# Patient Record
Sex: Female | Born: 1955 | Marital: Single | Smoking: Never smoker
Health system: Southern US, Community
[De-identification: ages and names within clinical notes are randomized; demographics above are authoritative.]

## PROBLEM LIST (undated history)

## (undated) DIAGNOSIS — Z803 Family history of malignant neoplasm of breast: Secondary | ICD-10-CM

## (undated) DIAGNOSIS — Z860100 Personal history of colon polyps, unspecified: Secondary | ICD-10-CM

## (undated) DIAGNOSIS — R011 Cardiac murmur, unspecified: Secondary | ICD-10-CM

## (undated) DIAGNOSIS — A6000 Herpesviral infection of urogenital system, unspecified: Secondary | ICD-10-CM

## (undated) DIAGNOSIS — Z800 Family history of malignant neoplasm of digestive organs: Secondary | ICD-10-CM

## (undated) DIAGNOSIS — N390 Urinary tract infection, site not specified: Secondary | ICD-10-CM

## (undated) DIAGNOSIS — Z807 Family history of other malignant neoplasms of lymphoid, hematopoietic and related tissues: Secondary | ICD-10-CM

## (undated) DIAGNOSIS — Z808 Family history of malignant neoplasm of other organs or systems: Secondary | ICD-10-CM

## (undated) DIAGNOSIS — E785 Hyperlipidemia, unspecified: Secondary | ICD-10-CM

## (undated) DIAGNOSIS — Z8049 Family history of malignant neoplasm of other genital organs: Secondary | ICD-10-CM

## (undated) DIAGNOSIS — M1990 Unspecified osteoarthritis, unspecified site: Secondary | ICD-10-CM

## (undated) DIAGNOSIS — K759 Inflammatory liver disease, unspecified: Secondary | ICD-10-CM

## (undated) HISTORY — DX: Family history of malignant neoplasm of breast: Z80.3

## (undated) HISTORY — DX: Inflammatory liver disease, unspecified: K75.9

## (undated) HISTORY — DX: Family history of malignant neoplasm of digestive organs: Z80.0

## (undated) HISTORY — DX: Cardiac murmur, unspecified: R01.1

## (undated) HISTORY — DX: Urinary tract infection, site not specified: N39.0

## (undated) HISTORY — PX: REDUCTION MAMMAPLASTY: SUR839

## (undated) HISTORY — PX: BREAST BIOPSY: SHX20

## (undated) HISTORY — DX: Unspecified osteoarthritis, unspecified site: M19.90

## (undated) HISTORY — DX: Family history of malignant neoplasm of other genital organs: Z80.49

## (undated) HISTORY — DX: Personal history of colon polyps, unspecified: Z86.0100

## (undated) HISTORY — DX: Hyperlipidemia, unspecified: E78.5

## (undated) HISTORY — DX: Herpesviral infection of urogenital system, unspecified: A60.00

## (undated) HISTORY — DX: Personal history of colonic polyps: Z86.010

## (undated) HISTORY — DX: Family history of malignant neoplasm of other organs or systems: Z80.8

## (undated) HISTORY — DX: Family history of other malignant neoplasms of lymphoid, hematopoietic and related tissues: Z80.7

---

## 2015-04-18 DIAGNOSIS — M25552 Pain in left hip: Secondary | ICD-10-CM | POA: Insufficient documentation

## 2018-06-20 ENCOUNTER — Ambulatory Visit (INDEPENDENT_AMBULATORY_CARE_PROVIDER_SITE_OTHER): Payer: BLUE CROSS/BLUE SHIELD

## 2018-06-20 ENCOUNTER — Encounter: Payer: Self-pay | Admitting: Podiatry

## 2018-06-20 ENCOUNTER — Ambulatory Visit: Payer: BLUE CROSS/BLUE SHIELD | Admitting: Podiatry

## 2018-06-20 VITALS — BP 144/78 | HR 64 | Resp 16

## 2018-06-20 DIAGNOSIS — M778 Other enthesopathies, not elsewhere classified: Secondary | ICD-10-CM

## 2018-06-20 DIAGNOSIS — M2012 Hallux valgus (acquired), left foot: Secondary | ICD-10-CM

## 2018-06-20 DIAGNOSIS — M7751 Other enthesopathy of right foot: Secondary | ICD-10-CM | POA: Diagnosis not present

## 2018-06-20 DIAGNOSIS — M779 Enthesopathy, unspecified: Secondary | ICD-10-CM

## 2018-06-21 NOTE — Progress Notes (Signed)
Subjective:  Patient ID: Maureen Peterson, female    DOB: 01/17/1956,  MRN: [id number] HPI Chief Complaint  Patient presents with  . Foot Pain    5th MPJ right - aching x 1 months, started new part time job standing a lot, walks barefoot all the time, puffy, tried wider shoes  . Foot Pain    1st MPJ left - bunion more tender - has questions  . New Patient (Initial Visit)    62 y.o. female presents with the above complaint.   ROS: Denies fever chills nausea vomiting muscle aches pains calf pain back pain chest pain shortness of breath.  No past medical history on file.   Current Outpatient Medications:  .  diclofenac (VOLTAREN) 75 MG EC tablet, Take 75 mg by mouth 2 (two) times daily., Disp: , Rfl:  .  diclofenac sodium (VOLTAREN) 1 % GEL, Apply topically 4 (four) times daily., Disp: , Rfl:  .  ezetimibe (ZETIA) 10 MG tablet, Take 10 mg by mouth daily., Disp: , Rfl:  .  ibuprofen (ADVIL,MOTRIN) 800 MG tablet, , Disp: , Rfl: 0  Allergies  Allergen Reactions  . Penicillin G Rash   Review of Systems Objective:   Vitals:   06/20/18 1429  BP: (!) 144/78  Pulse: 64  Resp: 16    General: Well developed, nourished, in no acute distress, alert and oriented x3   Dermatological: Skin is warm, dry and supple bilateral. Nails x 10 are well maintained; remaining integument appears unremarkable at this time. There are no open sores, no preulcerative lesions, no rash or signs of infection present.  Vascular: Dorsalis Pedis artery and Posterior Tibial artery pedal pulses are 2/4 bilateral with immedate capillary fill time. Pedal hair growth present. No varicosities and no lower extremity edema present bilateral.   Neruologic: Grossly intact via light touch bilateral. Vibratory intact via tuning fork bilateral. Protective threshold with Semmes Wienstein monofilament intact to all pedal sites bilateral. Patellar and Achilles deep tendon reflexes 2+ bilateral. No Babinski or clonus noted  bilateral.   Musculoskeletal: No gross boney pedal deformities bilateral. No pain, crepitus, or limitation noted with foot and ankle range of motion bilateral. Muscular strength 5/5 in all groups tested bilateral.  She has pain on range of motion and on palpation of the first metatarsophalangeal joint left.  There is no crepitation.  Right fifth metatarsal phalangeal joint area does demonstrate a red body fluctuant area beneath the fifth metatarsal head.  This does appear to be a bursitis or long-standing capsulitis with inflammation.  Gait: Unassisted, Nonantalgic.    Radiographs:  Radiographs taken today demonstrate soft tissue swelling along a plantar lateral aspect of the fifth metatarsal right foot is to be bursitis or gout on radiograph.  There is no acute finding.  Left foot does demonstrate some osteoarthritic changes at the level of the first metatarsophalangeal joint joint space narrowing some irregularity of the joint and subchondral bone.  Assessment & Plan:   Assessment: Bursitis fifth metatarsal phalangeal joint right foot.  Capsulitis first metatarsophalangeal joint left foot  Plan: After sterile Betadine skin prep I injected 20 mg Kenalog 5 mg Marcaine to the point of maximal tenderness into the bursa of the fifth metatarsal phalangeal joint area of the right foot.  At this point I also injected 2 mg of dexamethasone and local anesthetic to the first metatarsophalangeal joint of the left foot.  This was an intra-articular injection tolerated procedure well with no adverse effects.     Max T.  [place], [place]

## 2018-08-01 ENCOUNTER — Encounter: Payer: Self-pay | Admitting: Podiatry

## 2018-08-01 ENCOUNTER — Ambulatory Visit: Payer: BLUE CROSS/BLUE SHIELD | Admitting: Podiatry

## 2018-08-01 DIAGNOSIS — M7751 Other enthesopathy of right foot: Secondary | ICD-10-CM

## 2018-08-01 DIAGNOSIS — M779 Enthesopathy, unspecified: Secondary | ICD-10-CM | POA: Diagnosis not present

## 2018-08-01 DIAGNOSIS — M778 Other enthesopathies, not elsewhere classified: Secondary | ICD-10-CM

## 2018-08-02 NOTE — Progress Notes (Signed)
She presents today for follow-up of her bursitis of the fifth metatarsal area right foot and capsulitis to the first metatarsophalangeal joint left foot.  She states that she is been on her feet a lot and even so they seem to be doing better but there is still some pain.  He states that her daughter has breast cancer at the age of 63.  Objective: Vital signs are stable she is alert and oriented x3.  She has some tenderness on palpation sub-fifth metatarsal head of the right foot which appears to be more of a bursitis or even possible a nerve pain like a neuroma that is plantarly.  It may simply be inflammation of the flexor tendon.  She also has pain on palpation and range of motion of the first metatarsophalangeal joint of the left foot  Assessment: Bursitis neuritis sub-fifth metatarsal phalangeal joint of the right foot.  Capsulitis first metatarsophalangeal joint left foot.  Plan: Discussed etiology pathology conservative surgical therapies at this point I injected dexamethasone 2 mg after sterile Betadine skin prep to the sub-fifth metatarsal phalangeal joint area from the lateral aspect.  Tolerated procedure well without complications.  After sterile Betadine skin prep I then injected the first metatarsophalangeal joint of the left foot with dexamethasone 2 mg and local anesthetic.  Tolerated procedure well.  I will follow-up with her in the next few weeks for reevaluation.

## 2018-08-11 ENCOUNTER — Ambulatory Visit: Payer: BLUE CROSS/BLUE SHIELD | Admitting: Family Medicine

## 2018-08-11 ENCOUNTER — Encounter: Payer: Self-pay | Admitting: Family Medicine

## 2018-08-11 ENCOUNTER — Other Ambulatory Visit: Payer: Self-pay | Admitting: Family Medicine

## 2018-08-11 VITALS — BP 124/72 | HR 64 | Temp 97.8°F | Ht 66.0 in | Wt 162.0 lb

## 2018-08-11 DIAGNOSIS — Z8619 Personal history of other infectious and parasitic diseases: Secondary | ICD-10-CM | POA: Diagnosis not present

## 2018-08-11 DIAGNOSIS — Z131 Encounter for screening for diabetes mellitus: Secondary | ICD-10-CM

## 2018-08-11 DIAGNOSIS — Z0000 Encounter for general adult medical examination without abnormal findings: Secondary | ICD-10-CM | POA: Diagnosis not present

## 2018-08-11 DIAGNOSIS — M720 Palmar fascial fibromatosis [Dupuytren]: Secondary | ICD-10-CM | POA: Insufficient documentation

## 2018-08-11 DIAGNOSIS — H6982 Other specified disorders of Eustachian tube, left ear: Secondary | ICD-10-CM | POA: Diagnosis not present

## 2018-08-11 DIAGNOSIS — Z1231 Encounter for screening mammogram for malignant neoplasm of breast: Secondary | ICD-10-CM

## 2018-08-11 DIAGNOSIS — R599 Enlarged lymph nodes, unspecified: Secondary | ICD-10-CM

## 2018-08-11 DIAGNOSIS — R011 Cardiac murmur, unspecified: Secondary | ICD-10-CM | POA: Insufficient documentation

## 2018-08-11 DIAGNOSIS — E782 Mixed hyperlipidemia: Secondary | ICD-10-CM | POA: Diagnosis not present

## 2018-08-11 DIAGNOSIS — M1990 Unspecified osteoarthritis, unspecified site: Secondary | ICD-10-CM | POA: Insufficient documentation

## 2018-08-11 DIAGNOSIS — Z23 Encounter for immunization: Secondary | ICD-10-CM

## 2018-08-11 LAB — COMPREHENSIVE METABOLIC PANEL
ALBUMIN: 4.2 g/dL (ref 3.5–5.2)
ALT: 27 U/L (ref 0–35)
AST: 25 U/L (ref 0–37)
Alkaline Phosphatase: 122 U/L — ABNORMAL HIGH (ref 39–117)
BUN: 18 mg/dL (ref 6–23)
CHLORIDE: 106 meq/L (ref 96–112)
CO2: 29 mEq/L (ref 19–32)
CREATININE: 0.74 mg/dL (ref 0.40–1.20)
Calcium: 9.5 mg/dL (ref 8.4–10.5)
GFR: 84.05 mL/min (ref >60.00)
GLUCOSE: 104 mg/dL — AB (ref 70–99)
POTASSIUM: 4.4 meq/L (ref 3.5–5.1)
SODIUM: 140 meq/L (ref 135–145)
Total Bilirubin: 0.4 mg/dL (ref 0.2–1.2)
Total Protein: 6.8 g/dL (ref 6.0–8.3)

## 2018-08-11 LAB — CBC WITH DIFFERENTIAL/PLATELET
BASOS PCT: 0.7 % (ref 0.0–3.0)
Basophils Absolute: 0 10*3/uL (ref 0.0–0.1)
EOS ABS: 0 10*3/uL (ref 0.0–0.7)
Eosinophils Relative: 0.8 % (ref 0.0–5.0)
HEMATOCRIT: 38.5 % (ref 36.0–46.0)
HEMOGLOBIN: 12.6 g/dL (ref 12.0–15.0)
LYMPHS PCT: 36.9 % (ref 12.0–46.0)
Lymphs Abs: 2 10*3/uL (ref 0.7–4.0)
MCHC: 32.8 g/dL (ref 30.0–36.0)
MCV: 88.2 fl (ref 78.0–100.0)
MONO ABS: 0.4 10*3/uL (ref 0.1–1.0)
Monocytes Relative: 8.2 % (ref 3.0–12.0)
Neutro Abs: 2.8 10*3/uL (ref 1.4–7.7)
Neutrophils Relative %: 53.4 % (ref 43.0–77.0)
Platelets: 268 10*3/uL (ref 150.0–400.0)
RBC: 4.36 Mil/uL (ref 3.87–5.11)
RDW: 14.1 % (ref 11.5–15.5)
WBC: 5.3 10*3/uL (ref 4.0–10.5)

## 2018-08-11 LAB — LIPID PANEL
CHOLESTEROL: 231 mg/dL — AB (ref 0–200)
HDL: 45.8 mg/dL (ref >39.00)
LDL CALC: 147 mg/dL — AB (ref 0–99)
NONHDL: 184.77
Total CHOL/HDL Ratio: 5
Triglycerides: 188 mg/dL — ABNORMAL HIGH (ref 0.0–149.0)
VLDL: 37.6 mg/dL (ref 0.0–40.0)

## 2018-08-11 LAB — HEMOGLOBIN A1C: HEMOGLOBIN A1C: 6.1 % (ref 4.6–6.5)

## 2018-08-11 NOTE — Progress Notes (Signed)
Subjective:     Maureen Peterson is a 62 y.o. female and is here for a comprehensive physical exam. The patient reports problems - L ear discomfort, dupuytren's contracture, HLD, h/o heart murmur, arthritis.  Pt previously seen at [facility] in [place], [place] by Dr. August Luz.  H/o enlarged lymph nodes: -seen on CT -given concern for lymphoma was getting serial CTs to monitor progress.  -also had PET scan. -last CT Dec 2018  HLD: -taking Zetia -eating any and everything  H/o heart murmur: -had a cardiac cath -cannot remember if had an ECHO -was seen in [place], [place]  Dupuytren's contracture: -wakes up with R index finger "stuck" in a flexed position -had steroid injections in past -has been trying to find a hand surgeon to continue injections  H/o hepatitis? -pt notes having hepatitis when her daughter was 53 yo. -states was in the hospital and had to get shots  Arthritis: -in foot -seen by podiatry -had cortisone injections recently  -also had bursitis in R foot  Ear discomfort: -pt notes L ear [place] popping [place]  Allergies: PCN-hives Amoxicillin- stomach upset, hives  Past surg hx: -Cholecystectomy 1996 Breast biopsy 2017 Appendectomy 1996 Tonsillectomy 1959 Hysterectomy 1994 Left rotator cuff repair 1992 Bladder sling 2002 Left hip labral tear repair 2016 Carpal tunnel right hand 2018, carpal tunnel ulnar nerve moved 2018 Basal cell carcinoma removed from back 2015 Breast reduction, tummy tuck, liposuction 2011  Social hx: Pt is divorced.  Her husband was in the [employer].  Pt has 2 children (a son and a daughter).  Pt's daughter is also seen by this provider.  Pt recently moved to the area from [place], [place].  Pt denies tobacco and drug use.  Mammogram 2018, September Dentist--Dr. Domingo Dimes [phone] Vision-last eye exam 2018 Colonoscopy 2017 Social History   Socioeconomic History  . Marital  status: Single    Spouse name: Not on file  . Number of children: Not on file  . Years of education: Not on file  . Highest education level: Not on file  Occupational History  . Not on file  Social Needs  . Financial resource strain: Not on file  . Food insecurity:    Worry: Not on file    Inability: Not on file  . Transportation needs:    Medical: Not on file    Non-medical: Not on file  Tobacco Use  . Smoking status: Never Smoker  . Smokeless tobacco: Never Used  Substance and Sexual Activity  . Alcohol use: Not Currently  . Drug use: Not on file  . Sexual activity: Not on file  Lifestyle  . Physical activity:    Days per week: Not on file    Minutes per session: Not on file  . Stress: Not on file  Relationships  . Social connections:    Talks on phone: Not on file    Gets together: Not on file    Attends religious service: Not on file    Active member of club or organization: Not on file    Attends meetings of clubs or organizations: Not on file    Relationship status: Not on file  . Intimate partner violence:    Fear of current or ex partner: Not on file    Emotionally abused: Not on file    Physically abused: Not on file    Forced sexual activity: Not on file  Other Topics Concern  . Not on file  Social History Narrative  .  Not on file   Health Maintenance  Topic Date Due  . Hepatitis C Screening  Feb 22, 1956  . HIV Screening  11/19/1969  . TETANUS/TDAP  11/19/1973  . PAP SMEAR  11/20/1975  . MAMMOGRAM  11/19/2004  . COLONOSCOPY  11/19/2004  . INFLUENZA VACCINE  06/15/2018    The following portions of the patient's history were reviewed and updated as appropriate: allergies, current medications, past family history, past medical history, past social history, past surgical history and problem list.  Review of Systems A comprehensive review of systems was negative.   Objective:    BP 124/72   Pulse 64   Temp 97.8 F (36.6 C)   Ht 5\' 6"  (1.676 m)    Wt 162 lb (73.5 kg)   SpO2 97%   BMI 26.15 kg/m  General appearance: alert, cooperative and no distress Head: Normocephalic, without obvious abnormality, atraumatic Eyes: conjunctivae/corneas clear. PERRL, EOM's intact. Fundi benign. Ears: normal TM's and external ear canals both ears Nose: Nares normal. Septum midline. Mucosa normal. No drainage or sinus tenderness. Throat: lips, mucosa, and tongue normal; teeth and gums normal Neck: no adenopathy, no carotid bruit, no JVD, supple, symmetrical, trachea midline and thyroid not enlarged, symmetric, no tenderness/mass/nodules Lungs: clear to auscultation bilaterally Heart: RRR, faint 1/6 murmur heard. Abdomen: soft, non-tender; bowel sounds normal; no masses,  no organomegaly Extremities: extremities normal, atraumatic, no cyanosis or edema Skin: Skin color, texture, turgor normal. No rashes or lesions Lymph nodes: Cervical and supraclavicular nodes normal. Neurologic: Alert and oriented X 3, normal strength and tone. Normal symmetric reflexes. Normal coordination and gait    Assessment:    Healthy female exam.      Plan:     Anticipatory guidance given including wearing seatbelts, smoke detectors in the home, increasing physical activity, increasing p.o. intake of water and vegetables. -will obtain labs See After Visit Summary for Counseling Recommendations    Eustachian tube dysfunction -maneuver to relieve pressure done in office -discussed using flonase prn -given handout  Dupuytren's contracture -consider steroid injection -referral to hand surgery  HLD -lipid panel  Arthritis -continue f/u with podiatry  F/u prn  Abbe Amsterdam, MD

## 2018-08-11 NOTE — Patient Instructions (Addendum)
Preventive Care 40-64 Years, Female Preventive care refers to lifestyle choices and visits with your health care provider that can promote health and wellness. What does preventive care include?  A yearly physical exam. This is also called an annual well check.  Dental exams once or twice a year.  Routine eye exams. Ask your health care provider how often you should have your eyes checked.  Personal lifestyle choices, including: ? Daily care of your teeth and gums. ? Regular physical activity. ? Eating a healthy diet. ? Avoiding tobacco and drug use. ? Limiting alcohol use. ? Practicing safe sex. ? Taking low-dose aspirin daily starting at age 58. ? Taking vitamin and mineral supplements as recommended by your health care provider. What happens during an annual well check? The services and screenings done by your health care provider during your annual well check will depend on your age, overall health, lifestyle risk factors, and family history of disease. Counseling Your health care provider may ask you questions about your:  Alcohol use.  Tobacco use.  Drug use.  Emotional well-being.  Home and relationship well-being.  Sexual activity.  Eating habits.  Work and work Statistician.  Method of birth control.  Menstrual cycle.  Pregnancy history.  Screening You may have the following tests or measurements:  Height, weight, and BMI.  Blood pressure.  Lipid and cholesterol levels. These may be checked every 5 years, or more frequently if you are over 81 years old.  Skin check.  Lung cancer screening. You may have this screening every year starting at age 78 if you have a 30-pack-year history of smoking and currently smoke or have quit within the past 15 years.  Fecal occult blood test (FOBT) of the stool. You may have this test every year starting at age 65.  Flexible sigmoidoscopy or colonoscopy. You may have a sigmoidoscopy every 5 years or a colonoscopy  every 10 years starting at age 30.  Hepatitis C blood test.  Hepatitis B blood test.  Sexually transmitted disease (STD) testing.  Diabetes screening. This is done by checking your blood sugar (glucose) after you have not eaten for a while (fasting). You may have this done every 1-3 years.  Mammogram. This may be done every 1-2 years. Talk to your health care provider about when you should start having regular mammograms. This may depend on whether you have a family history of breast cancer.  BRCA-related cancer screening. This may be done if you have a family history of breast, ovarian, tubal, or peritoneal cancers.  Pelvic exam and Pap test. This may be done every 3 years starting at age 80. Starting at age 36, this may be done every 5 years if you have a Pap test in combination with an HPV test.  Bone density scan. This is done to screen for osteoporosis. You may have this scan if you are at high risk for osteoporosis.  Discuss your test results, treatment options, and if necessary, the need for more tests with your health care provider. Vaccines Your health care provider may recommend certain vaccines, such as:  Influenza vaccine. This is recommended every year.  Tetanus, diphtheria, and acellular pertussis (Tdap, Td) vaccine. You may need a Td booster every 10 years.  Varicella vaccine. You may need this if you have not been vaccinated.  Zoster vaccine. You may need this after age 5.  Measles, mumps, and rubella (MMR) vaccine. You may need at least one dose of MMR if you were born in  1958 or later. You may also need a second dose.  Pneumococcal 13-valent conjugate (PCV13) vaccine. You may need this if you have certain conditions and were not previously vaccinated.  Pneumococcal polysaccharide (PPSV23) vaccine. You may need one or two doses if you smoke cigarettes or if you have certain conditions.  Meningococcal vaccine. You may need this if you have certain  conditions.  Hepatitis A vaccine. You may need this if you have certain conditions or if you travel or work in places where you may be exposed to hepatitis A.  Hepatitis B vaccine. You may need this if you have certain conditions or if you travel or work in places where you may be exposed to hepatitis B.  Haemophilus influenzae type b (Hib) vaccine. You may need this if you have certain conditions.  Talk to your health care provider about which screenings and vaccines you need and how often you need them. This information is not intended to replace advice given to you by your health care provider. Make sure you discuss any questions you have with your health care provider. Document Released: 11/28/2015 Document Revised: 07/21/2016 Document Reviewed: 09/02/2015 Elsevier Interactive Patient Education  2018 [employer]  Eustachian Tube Dysfunction The eustachian tube connects the middle ear to the back of the nose. It regulates air pressure in the middle ear by allowing air to move between the ear and nose. It also helps to drain fluid from the middle ear space. When the eustachian tube does not function properly, air pressure, fluid, or both can build up in the middle ear. Eustachian tube dysfunction can affect one or both ears. What are the causes? This condition happens when the eustachian tube becomes blocked or cannot open normally. This may result from:  Ear infections.  Colds and other upper respiratory infections.  Allergies.  Irritation, such as from cigarette smoke or acid from the stomach coming up into the esophagus (gastroesophageal reflux).  Sudden changes in air pressure, such as from descending in an airplane.  Abnormal growths in the nose or throat, such as nasal polyps, tumors, or enlarged tissue at the back of the throat (adenoids).  What increases the risk? This condition may be more likely to develop in people who smoke and people who are overweight. Eustachian  tube dysfunction may also be more likely to develop in children, especially children who have:  Certain birth defects of the mouth, such as cleft palate.  Large tonsils and adenoids.  What are the signs or symptoms? Symptoms of this condition may include:  A feeling of fullness in the ear.  Ear pain.  Clicking or popping noises in the ear.  Ringing in the ear.  Hearing loss.  Loss of balance.  Symptoms may get worse when the air pressure around you changes, such as when you travel to an area of high elevation or fly on an airplane. How is this diagnosed? This condition may be diagnosed based on:  Your symptoms.  A physical exam of your ear, nose, and throat.  Tests, such as those that measure: ? The movement of your eardrum (tympanogram). ? Your hearing (audiometry).  How is this treated? Treatment depends on the cause and severity of your condition. If your symptoms are mild, you may be able to relieve your symptoms by moving air into ("popping") your ears. If you have symptoms of fluid in your ears, treatment may include:  Decongestants.  Antihistamines.  Nasal sprays or ear drops that contain medicines that reduce swelling (  steroids).  In some cases, you may need to have a procedure to drain the fluid in your eardrum (myringotomy). In this procedure, a small tube is placed in the eardrum to:  Drain the fluid.  Restore the air in the middle ear space.  Follow these instructions at home:  Take over-the-counter and prescription medicines only as told by your health care provider.  Use techniques to help pop your ears as recommended by your health care provider. These may include: ? Chewing gum. ? Yawning. ? Frequent, forceful swallowing. ? Closing your mouth, holding your nose closed, and gently blowing as if you are trying to blow air out of your nose.  Do not do any of the following until your health care provider approves: ? Travel to high  altitudes. ? Fly in airplanes. ? Work in a pressurized cabin or room. ? Scuba dive.  Keep your ears dry. Dry your ears completely after showering or bathing.  Do not smoke.  Keep all follow-up visits as told by your health care provider. This is important. Contact a health care provider if:  Your symptoms do not go away after treatment.  Your symptoms come back after treatment.  You are unable to pop your ears.  You have: ? A fever. ? Pain in your ear. ? Pain in your head or neck. ? Fluid draining from your ear.  Your hearing suddenly changes.  You become very dizzy.  You lose your balance. This information is not intended to replace advice given to you by your health care provider. Make sure you discuss any questions you have with your health care provider. Document Released: 11/28/2015 Document Revised: 04/08/2016 Document Reviewed: 11/20/2014 Elsevier Interactive Patient Education  2018 [employer]  Dupuytren Contracture Dupuytren contracture is a condition in which tissue under the skin of the palm becomes abnormally thickened. This causes one or more of the fingers to curl inward (contract) toward the palm. Eventually, the fingers may not be able to straighten out. This condition affects some or all of the fingers and the palm of the hand. It is often passed along from parent to child (inherited). Dupuytren contracture is a long-term (chronic) condition that develops (progresses) slowly over time. There is no cure, but symptoms can be managed and progression can be slowed with treatment. This condition is usually not dangerous or painful, but it can interfere with everyday tasks. What are the causes? This condition is caused by tissue (fascia) in the palm getting thicker and tighter. When the fascia thickens, it pulls on the cords of tissue (tendons) that control finger movement. This causes the fingers to contract. The cause of fascia thickening is not known. What  increases the risk? This condition may be more likely to develop in:  People who are age 40 or older.  Men.  People with a family history of this condition.  People who use tobacco products, including cigarettes, chewing tobacco, and e-cigarettes.  People who drink alcohol excessively.  People with diabetes.  People with autoimmune diseases, such as HIV.  People with seizure disorders.  What are the signs or symptoms? Symptoms may develop in one or both hands. Any of the fingers can contract. The fingers farthest from the thumb are commonly affected. Usually, this condition is painless. You may have discomfort when holding or grabbing objects. Early symptoms of this condition may include:  Thick, puckered skin on the hand.  One or more lumps (nodules) on the palm. Nodules may be tender when they   first appear, but they are generally painless.  Symptoms of this condition develop slowly over months or years. Later symptoms of this condition may include:  Thick cords of tissue in the palm.  Fingers curled up toward the palm.  Inability to straighten the fingers into their normal position.  How is this diagnosed? This condition is diagnosed with a physical exam, which may include:  Looking at your hands and feeling your hands. This is to check for thickened fascia and nodules.  Measuring finger motion.  Doing the [place] Tabletop Test. You may be asked to try to put your hand on a surface, with your palm down and your fingers straight out.  How is this treated? There is no cure for this condition, but treatment can make symptoms more manageable and relieve discomfort. Treatment options may include:  Physical therapy. This can strengthen your hand and increase flexibility.  Occupational therapy. This can help you with everyday tasks that may be more difficult because of your condition.  A hand splint.  Shots (injections). Substances may be injected into your hand, such  as: ? Medicines that help to decrease swelling (corticosteroids). ? Proteins (collagenase) to weaken thick tissue. After a collagenase injection, your health care provider may stretch your fingers.  Needle aponeurotomy. In this procedure, a needle is pushed through the skin and into the fascia. Moving the needle against the fascia can weaken or break up the thick tissue.  Surgery. This may be needed if your condition causes discomfort or interferes with everyday activities. Physical therapy is usually needed after surgery.  In some cases, symptoms never develop to the point of needing major treatment, and caring for yourself at home can be enough to manage your condition. Symptoms often return after treatment. Follow these instructions at home: If you have a splint:  Do not put pressure on any part of the splint until it is fully hardened. This may take several hours.  Wear the splint as told by your health care provider. Remove it only as told by your health care provider.  Loosen the splint if your fingers tingle, become numb, or turn cold and blue.  Do not let your splint get wet if it is not waterproof. ? If your splint is not waterproof, cover it with a watertight covering when you take a bath or a shower. ? Do not take baths, swim, or use a hot tub until your health care provider approves. Ask your health care provider if you can take showers. You may only be allowed to take sponge baths for bathing.  Keep the splint clean.  Ask your health care provider when it is safe to drive. Hand Care  Take these actions to help protect your hand from possible injury: ? Use tools that have padded grips. ? Wear protective gloves while you work with your hands. ? Avoid repetitive hand movements.  Avoid actions that cause pain or discomfort.  Stretch your hand by gently pulling your fingers backward toward your wrist. Do this as often as is comfortable. Stop if this causes pain.  Gently  massage your hand as often as is comfortable.  If directed, apply heat to the affected area as often as told by your health care provider. Use the heat source that your health care provider recommends, such as a moist heat pack or a heating pad. ? Place a towel between your skin and the heat source. ? Leave the heat on for 20-30 minutes. ? Remove the heat if   your skin turns bright red. This is especially important if you are unable to feel pain, heat, or cold. You may have a greater risk of getting burned. General instructions  Take over-the-counter and prescription medicines only as told by your health care provider.  Manage any other conditions that you have, such as diabetes.  If physical therapy was prescribed, do exercises as told by your health care provider.  Keep all follow-up visits as told by your health care provider. This is important. Contact a health care provider if:  You develop new symptoms, or your symptoms get worse.  You have pain that gets worse or does not get better with medicine.  You have difficulty or discomfort with everyday tasks.  You have problems with your splint.  You develop numbness or tingling. Get help right away if:  You have severe pain.  Your fingers change color or become unusually cold. This information is not intended to replace advice given to you by your health care provider. Make sure you discuss any questions you have with your health care provider. Document Released: 08/29/2009 Document Revised: 12/16/2015 Document Reviewed: 03/26/2015 Elsevier Interactive Patient Education  2018 [employer] Inc[employer]  

## 2018-08-23 ENCOUNTER — Ambulatory Visit: Payer: Self-pay

## 2018-08-29 ENCOUNTER — Ambulatory Visit (INDEPENDENT_AMBULATORY_CARE_PROVIDER_SITE_OTHER): Payer: BLUE CROSS/BLUE SHIELD | Admitting: *Deleted

## 2018-08-29 DIAGNOSIS — Z23 Encounter for immunization: Secondary | ICD-10-CM | POA: Diagnosis not present

## 2018-09-04 ENCOUNTER — Telehealth: Payer: Self-pay | Admitting: Licensed Clinical Social Worker

## 2018-09-04 NOTE — Telephone Encounter (Signed)
Pt's daughter cld to schedule a genetic counseling appt. Ms. Gauger has been scheduled to see Lacy Duverney on 11/14 at 9am.

## 2018-09-05 ENCOUNTER — Ambulatory Visit: Payer: BLUE CROSS/BLUE SHIELD | Admitting: Family Medicine

## 2018-09-05 ENCOUNTER — Encounter: Payer: Self-pay | Admitting: Family Medicine

## 2018-09-05 VITALS — BP 120/70 | HR 61 | Temp 98.1°F | Resp 16 | Ht 66.0 in | Wt 169.0 lb

## 2018-09-05 DIAGNOSIS — J069 Acute upper respiratory infection, unspecified: Secondary | ICD-10-CM | POA: Diagnosis not present

## 2018-09-05 DIAGNOSIS — J029 Acute pharyngitis, unspecified: Secondary | ICD-10-CM

## 2018-09-05 LAB — POCT RAPID STREP A (OFFICE): Rapid Strep A Screen: NEGATIVE

## 2018-09-05 NOTE — Progress Notes (Signed)
Subjective:    Patient ID: Maureen Peterson, female    DOB: 12-04-1955, 62 y.o.   MRN: [id number]  Chief Complaint  Patient presents with  . Otalgia    throbbing pain in left ear  . Facial Pain    left side of face   . Sore Throat    scatchy throat x 3 days    HPI Patient was seen today for left ear pain/throbbing, sore/scratchy throat x3 days.  Pt notes left-sided facial pain.  Pt took 3 leftover Bactrim, gargled with warm salt water, and Flonase.  Patient endorses having rhinorrhea on a regular basis.  Pt denies cough, fever.  Pt requesting another antibiotic and a steroid shot as she does not want to get her daughter Maureen Peterson sick as she is doing chemotherapy.  Past Medical History:  Diagnosis Date  . Arthritis   . Genital herpes   . Heart murmur   . Hepatitis   . History of colon polyps   . Hyperlipidemia   . UTI (urinary tract infection)     Allergies  Allergen Reactions  . Amoxicillin     Rash  . Penicillin G Rash    ROS General: Denies fever, chills, night sweats, changes in weight, changes in appetite HEENT: Denies headaches, changes in vision, rhinorrhea  + ear pain, face pain, sore throat CV: Denies CP, palpitations, SOB, orthopnea Pulm: Denies SOB, cough, wheezing GI: Denies abdominal pain, nausea, vomiting, diarrhea, constipation GU: Denies dysuria, hematuria, frequency, vaginal discharge Msk: Denies muscle cramps, joint pains Neuro: Denies weakness, numbness, tingling Skin: Denies rashes, bruising Psych: Denies depression, anxiety, hallucinations     Objective:    Blood pressure 120/70, pulse 61, temperature 98.1 F (36.7 C), resp. rate 16, height 5\' 6"  (1.676 m), weight 169 lb (76.7 kg), SpO2 97 %.   Gen. Pleasant, well-nourished, in no distress, normal affect   HEENT: [place]/AT, face symmetric, no scleral icterus, PERRLA, nares patent without drainage, pharynx without erythema or exudate.  TMs full, no cervical lymphadenopathy Lungs: no accessory  muscle use, CTAB, no wheezes or rales Cardiovascular: RRR, no m/r/g, no peripheral edema Neuro:  A&Ox3, CN II-XII intact, normal gait  Wt Readings from Last 3 Encounters:  09/05/18 169 lb (76.7 kg)  08/11/18 162 lb (73.5 kg)    Lab Results  Component Value Date   WBC 5.3 08/11/2018   HGB 12.6 08/11/2018   HCT 38.5 08/11/2018   PLT 268.0 08/11/2018   GLUCOSE 104 (H) 08/11/2018   CHOL 231 (H) 08/11/2018   TRIG 188.0 (H) 08/11/2018   HDL 45.80 08/11/2018   LDLCALC 147 (H) 08/11/2018   ALT 27 08/11/2018   AST 25 08/11/2018   NA 140 08/11/2018   K 4.4 08/11/2018   CL 106 08/11/2018   CREATININE 0.74 08/11/2018   BUN 18 08/11/2018   CO2 29 08/11/2018   HGBA1C 6.1 08/11/2018    Assessment/Plan:  Viral URI -Discussed supportive care -encouraged to continue gargling with warm salt water or Chloraseptic spray, using OTC cold medicine, and handwashing. -given handout -Discussed continued symptoms or symptoms that improved-return within be considered for possible sinusitis.  Pt advised that this could happen. -Patient advised not to take leftover antibiotics as this could lead to resistance.  Also discussed viral infections are not relieved by antibiotics and antibiotic overuse.  Sore throat  - Plan: POCT rapid strep A--negative  Follow-up PRN  Abbe Amsterdam, MD

## 2018-09-05 NOTE — Patient Instructions (Signed)
Upper Respiratory Infection, Adult Most upper respiratory infections (URIs) are a viral infection of the air passages leading to the lungs. A URI affects the nose, throat, and upper air passages. The most common type of URI is nasopharyngitis and is typically referred to as "the common cold." URIs run their course and usually go away on their own. Most of the time, a URI does not require medical attention, but sometimes a bacterial infection in the upper airways can follow a viral infection. This is called a secondary infection. Sinus and middle ear infections are common types of secondary upper respiratory infections. Bacterial pneumonia can also complicate a URI. A URI can worsen asthma and chronic obstructive pulmonary disease (COPD). Sometimes, these complications can require emergency medical care and may be life threatening. What are the causes? Almost all URIs are caused by viruses. A virus is a type of germ and can spread from one person to another. What increases the risk? You may be at risk for a URI if:  You smoke.  You have chronic heart or lung disease.  You have a weakened defense (immune) system.  You are very young or very old.  You have nasal allergies or asthma.  You work in crowded or poorly ventilated areas.  You work in health care facilities or schools.  What are the signs or symptoms? Symptoms typically develop 2-3 days after you come in contact with a cold virus. Most viral URIs last 7-10 days. However, viral URIs from the influenza virus (flu virus) can last 14-18 days and are typically more severe. Symptoms may include:  Runny or stuffy (congested) nose.  Sneezing.  Cough.  Sore throat.  Headache.  Fatigue.  Fever.  Loss of appetite.  Pain in your forehead, behind your eyes, and over your cheekbones (sinus pain).  Muscle aches.  How is this diagnosed? Your health care provider may diagnose a URI by:  Physical exam.  Tests to check that your  symptoms are not due to another condition such as: ? Strep throat. ? Sinusitis. ? Pneumonia. ? Asthma.  How is this treated? A URI goes away on its own with time. It cannot be cured with medicines, but medicines may be prescribed or recommended to relieve symptoms. Medicines may help:  Reduce your fever.  Reduce your cough.  Relieve nasal congestion.  Follow these instructions at home:  Take medicines only as directed by your health care provider.  Gargle warm saltwater or take cough drops to comfort your throat as directed by your health care provider.  Use a warm mist humidifier or inhale steam from a shower to increase air moisture. This may make it easier to breathe.  Drink enough fluid to keep your urine clear or pale yellow.  Eat soups and other clear broths and maintain good nutrition.  Rest as needed.  Return to work when your temperature has returned to normal or as your health care provider advises. You may need to stay home longer to avoid infecting others. You can also use a face mask and careful hand washing to prevent spread of the virus.  Increase the usage of your inhaler if you have asthma.  Do not use any tobacco products, including cigarettes, chewing tobacco, or electronic cigarettes. If you need help quitting, ask your health care provider. How is this prevented? The best way to protect yourself from getting a cold is to practice good hygiene.  Avoid oral or hand contact with people with cold symptoms.  Wash your   hands often if contact occurs.  There is no clear evidence that vitamin C, vitamin E, echinacea, or exercise reduces the chance of developing a cold. However, it is always recommended to get plenty of rest, exercise, and practice good nutrition. Contact a health care provider if:  You are getting worse rather than better.  Your symptoms are not controlled by medicine.  You have chills.  You have worsening shortness of breath.  You have  brown or red mucus.  You have yellow or brown nasal discharge.  You have pain in your face, especially when you bend forward.  You have a fever.  You have swollen neck glands.  You have pain while swallowing.  You have white areas in the back of your throat. Get help right away if:  You have severe or persistent: ? Headache. ? Ear pain. ? Sinus pain. ? Chest pain.  You have chronic lung disease and any of the following: ? Wheezing. ? Prolonged cough. ? Coughing up blood. ? A change in your usual mucus.  You have a stiff neck.  You have changes in your: ? Vision. ? Hearing. ? Thinking. ? Mood. This information is not intended to replace advice given to you by your health care provider. Make sure you discuss any questions you have with your health care provider. Document Released: 04/27/2001 Document Revised: 07/04/2016 Document Reviewed: 02/06/2014 Elsevier Interactive Patient Education  2018 [employer]  

## 2018-09-11 ENCOUNTER — Ambulatory Visit
Admission: RE | Admit: 2018-09-11 | Discharge: 2018-09-11 | Disposition: A | Discharge status: Home / Self Care | Payer: BLUE CROSS/BLUE SHIELD | Source: Ambulatory Visit | Attending: Family Medicine | Admitting: Family Medicine

## 2018-09-11 DIAGNOSIS — Z1231 Encounter for screening mammogram for malignant neoplasm of breast: Secondary | ICD-10-CM

## 2018-09-14 ENCOUNTER — Encounter: Payer: BLUE CROSS/BLUE SHIELD | Admitting: Licensed Clinical Social Worker

## 2018-09-28 ENCOUNTER — Inpatient Hospital Stay: Payer: BLUE CROSS/BLUE SHIELD

## 2018-09-28 ENCOUNTER — Inpatient Hospital Stay: Payer: BLUE CROSS/BLUE SHIELD | Attending: Genetic Counselor | Admitting: Licensed Clinical Social Worker

## 2018-09-28 ENCOUNTER — Encounter: Payer: Self-pay | Admitting: Licensed Clinical Social Worker

## 2018-09-28 DIAGNOSIS — Z803 Family history of malignant neoplasm of breast: Secondary | ICD-10-CM

## 2018-09-28 DIAGNOSIS — Z808 Family history of malignant neoplasm of other organs or systems: Secondary | ICD-10-CM | POA: Insufficient documentation

## 2018-09-28 DIAGNOSIS — Z8049 Family history of malignant neoplasm of other genital organs: Secondary | ICD-10-CM | POA: Insufficient documentation

## 2018-09-28 DIAGNOSIS — Z807 Family history of other malignant neoplasms of lymphoid, hematopoietic and related tissues: Secondary | ICD-10-CM

## 2018-09-28 DIAGNOSIS — Z800 Family history of malignant neoplasm of digestive organs: Secondary | ICD-10-CM

## 2018-09-28 NOTE — Progress Notes (Signed)
REFERRING PROVIDER: Self-referred  PRIMARY PROVIDER:  Billie Ruddy, MD  PRIMARY REASON FOR VISIT:  1. Family history of breast cancer   2. Family history of pancreatic cancer   3. Family history of uterine cancer   4. Family history of melanoma   5. Family history of non-Hodgkin's lymphoma      HISTORY OF PRESENT ILLNESS:   Maureen Peterson, a 62 y.o. female, was seen for a [place] cancer genetics consultation due to a family history of cancer.  Maureen Peterson presents to clinic today to discuss the possibility of a hereditary predisposition to cancer, genetic testing, and to further clarify her future cancer risks, as well as potential cancer risks for family members.    Maureen Peterson is a 62 y.o. female with no personal history of cancer.    HORMONAL RISK FACTORS:  Menarche was at age 3.  First live birth at age 20.  OCP use for approximately 5 years.  Ovaries intact: no.  Hysterectomy: yes.  Menopausal status: postmenopausal.  HRT use: 0 years. Colonoscopy: yes; normal. She reports 1 polyp found on 2018 cscope. Mammogram within the last year: yes. Number of breast biopsies: 1.  Past Medical History:  Diagnosis Date  . Arthritis   . Family history of breast cancer   . Family history of melanoma   . Family history of non-Hodgkin's lymphoma   . Family history of pancreatic cancer   . Family history of uterine cancer   . Genital herpes   . Heart murmur   . Hepatitis   . History of colon polyps   . Hyperlipidemia   . UTI (urinary tract infection)     Past Surgical History:  Procedure Laterality Date  . BREAST BIOPSY Left   . REDUCTION MAMMAPLASTY      Social History   Socioeconomic History  . Marital status: Single    Spouse name: Not on file  . Number of children: Not on file  . Years of education: Not on file  . Highest education level: Not on file  Occupational History  . Not on file  Social Needs  . Financial resource strain: Not on file  . Food  insecurity:    Worry: Not on file    Inability: Not on file  . Transportation needs:    Medical: Not on file    Non-medical: Not on file  Tobacco Use  . Smoking status: Never Smoker  . Smokeless tobacco: Never Used  Substance and Sexual Activity  . Alcohol use: Not Currently  . Drug use: Not on file  . Sexual activity: Not on file  Lifestyle  . Physical activity:    Days per week: Not on file    Minutes per session: Not on file  . Stress: Not on file  Relationships  . Social connections:    Talks on phone: Not on file    Gets together: Not on file    Attends religious service: Not on file    Active member of club or organization: Not on file    Attends meetings of clubs or organizations: Not on file    Relationship status: Not on file  Other Topics Concern  . Not on file  Social History Narrative  . Not on file     FAMILY HISTORY:  We obtained a detailed, 4-generation family history.  Significant diagnoses are listed below: Family History  Problem Relation Age of Onset  . Breast cancer Daughter 61  . Uterine cancer Mother  54  . Pancreatic cancer Mother 46  . Melanoma Father        on arm  . Non-Hodgkin's lymphoma Brother 71    Maureen Peterson has one daughter, Maureen Peterson, who is 79 and was recently diagnosed with breast cancer. She tested negative on the [facility] Multi-Cancer Panel. Maureen Peterson also has one son, Maureen Peterson, who is 68. Maureen Peterson had three brothers and one sister. One of her brothers, Maureen Peterson, had Non-Hodgkin's Lymphoma and died at 63. Another brother, Maureen Peterson, passed away at age 17. Her other siblings are living and healthy.   Ms. Emmitt mother, Maureen Peterson, was diagnosed with uterine cancer at 31 and pancreatic cancer at 27. She passed away at 107. She did not have brothers or sisters. Maureen Peterson maternal grandmother died in her 57's and possibly had bladder cancer. She does not know when her maternal grandfather died.   Maureen Peterson father, Maureen Peterson, died at 11. He had a history  of melanoma on his arm. He had 13 brothers and sisters, but [name] does not have any information about them or their children. She also has no information about her paternal grandfather. She believes her paternal grandmother died in her 57's.   Maureen Peterson is aware of previous family history of genetic testing for hereditary cancer risks. Patient's ancestors are of Irish/Italian descent. There is no reported Ashkenazi Jewish ancestry. There is no known consanguinity.  GENETIC COUNSELING ASSESSMENT: Maureen Peterson is a 62 y.o. female with a family history which is somewhat suggestive of a Hereditary Cancer Predisposition Syndrome. We, therefore, discussed and recommended the following at today's visit.   DISCUSSION: We discussed that about 5-10% of cancer cases are hereditary. We discussed that there are many genes associated with pancreatic cancer and with uterine cancer. Most of this was review for Maureen Peterson since she was present at her daughter's genetic counseling session. We reviewed the characteristics, features and inheritance patterns of hereditary cancer syndromes. We also discussed genetic testing, including the appropriate family members to test, the process of testing, insurance coverage and turn-around-time for results. We discussed the implications of a negative, positive and/or variant of uncertain significant result. We recommended Maureen Peterson pursue genetic testing for the [facility] Multi-Cancer Panel.  The Multi-Cancer Panel offered by [facility] includes sequencing and/or deletion duplication testing of the following 84 genes: AIP, ALK, APC, ATM, AXIN2,BAP1,  BARD1, BLM, BMPR1A, BRCA1, BRCA2, BRIP1, CASR, CDC73, CDH1, CDK4, CDKN1B, CDKN1C, CDKN2A (p14ARF), CDKN2A (p16INK4a), CEBPA, CHEK2, CTNNA1, DICER1, DIS3L2, EGFR (c.2369C>T, p.Thr790Met variant only), EPCAM (Deletion/duplication testing only), FH, FLCN, GATA2, GPC3, GREM1 (Promoter region deletion/duplication testing only), HOXB13  (c.251G>A, p.Gly84Glu), HRAS, KIT, MAX, MEN1, MET, MITF (c.952G>A, p.Glu318Lys variant only), MLH1, MSH2, MSH3, MSH6, MUTYH, NBN, NF1, NF2, NTHL1, PALB2, PDGFRA, PHOX2B, PMS2, POLD1, POLE, POT1, PRKAR1A, PTCH1, PTEN, RAD50, RAD51C, RAD51D, RB1, RECQL4, RET, RUNX1, SDHAF2, SDHA (sequence changes only), SDHB, SDHC, SDHD, SMAD4, SMARCA4, SMARCB1, SMARCE1, STK11, SUFU, TERC, TERT, TMEM127, TP53, TSC1, TSC2, VHL, WRN and WT1.   We discussed that if she is found to have a mutation in one of these genes, it may impact future medical management recommendations such as increased cancer screenings and consideration of risk reducing surgeries.  A positive result could also have implications for the patient's family members.  A Negative result would mean we were unable to identify a hereditary component to her family history of cancer but does not rule out the possibility of a hereditary basis for her family history of cancer.  There could be  mutations that are undetectable by current technology, or in genes not yet tested or identified to increase cancer risk.    We discussed the potential to find a Variant of Uncertain Significance or VUS.  These are variants that have not yet been identified as pathogenic or benign, and it is unknown if this variant is associated with increased cancer risk or if this is a normal finding.  Most VUS's are reclassified to benign or likely benign.   It should not be used to make medical management decisions. With time, we suspect the lab will determine the significance of any VUS's identified if any.   Based on Ms. Abate's family history of cancer, she meets NCCN medical criteria for genetic testing. Despite that she meets criteria, she may still have an out of pocket cost. The lab will notify her of an out of pocket cost, if any.  We discussed that some people do not want to undergo genetic testing due to fear of genetic discrimination.  A federal law called the Genetic Information  Non-Discrimination Act (GINA) of 2008 helps protect individuals against genetic discrimination based on their genetic test results.  It impacts both health insurance and employment.  For health insurance, it protects against increased premiums, being kicked off insurance or being forced to take a test in order to be insured.  For employment it protects against hiring, firing and promoting decisions based on genetic test results.  Health status due to a cancer diagnosis is not protected under GINA.  This law does not protect life insurance, disability insurance, or other types of insurance.   In order to estimate her chance of having a mutation, we used statistical model PancPro that takes into account her personal medical history, family history and ancestry.  Because each model is different, there can be a lot of variability in the risks they give.  Therefore, this number must be considered a rough estimate and not a precise risk of having a mutation.  These models estimate that she has approximately a 12.38% chance of having a mutation. Based on this assessment of her family and personal history, genetic testing is recommended.   Based on the patient's personal and family history, the statistical model Tyrer Cuzick was used to estimate her risk of developing breast cancer. This estimates her lifetime risk of developing breast cancer to be approximately 9.8%. This estimation is in the setting of negative test results and may change if she has a positive genetic test result.  The patient's lifetime breast cancer risk is a preliminary estimate based on available information using one of several models endorsed by the American Cancer Society (ACS). The ACS recommends consideration of breast MRI screening as an adjunct to mammography for patients at high risk (defined as 20% or greater lifetime risk). A more detailed breast cancer risk assessment can be considered, if clinically indicated.    PLAN: After  considering the risks, benefits, and limitations, Ms. Knoche  provided informed consent to pursue genetic testing and the blood sample was sent to [facility] for analysis of the Multi-Cancer Panel. Results should be available within approximately 2-3 weeks' time, at which point they will be disclosed by telephone to Ms. Keeble, as will any additional recommendations warranted by these results. Ms. Metsker will receive a summary of her genetic counseling visit and a copy of her results once available. This information will also be available in Epic.  Based on Ms. Ferch's family history, we recommended siblings have genetic counseling   and testing. Ms. Schiavo will let us know if we can be of any assistance in coordinating genetic counseling and/or testing for these family members.  Lastly, we encouraged Ms. Currey to remain in contact with cancer genetics annually so that we can continuously update the family history and inform her of any changes in cancer genetics and testing that may be of benefit for this family.   Ms.  Doverspike's questions were answered to her satisfaction today. Our contact information was provided should additional questions or concerns arise. Thank you for the referral and allowing us to share in the care of your patient.   Brianna Cowan, MS Genetic Counselor [email or web address] Phone: [phone]  The patient was seen for a total of 20 minutes in face-to-face genetic counseling.  

## 2018-10-08 ENCOUNTER — Encounter: Payer: Self-pay | Admitting: Family Medicine

## 2018-10-09 ENCOUNTER — Telehealth: Payer: Self-pay | Admitting: Licensed Clinical Social Worker

## 2018-10-09 ENCOUNTER — Encounter: Payer: Self-pay | Admitting: Licensed Clinical Social Worker

## 2018-10-09 DIAGNOSIS — Z1379 Encounter for other screening for genetic and chromosomal anomalies: Secondary | ICD-10-CM | POA: Insufficient documentation

## 2018-10-10 ENCOUNTER — Ambulatory Visit: Payer: Self-pay | Admitting: Licensed Clinical Social Worker

## 2018-10-10 ENCOUNTER — Encounter: Payer: Self-pay | Admitting: Licensed Clinical Social Worker

## 2018-10-10 DIAGNOSIS — Z1379 Encounter for other screening for genetic and chromosomal anomalies: Secondary | ICD-10-CM

## 2018-10-10 NOTE — Telephone Encounter (Signed)
Revealed negative genetic testing.  Revealed that a VUS in APC was identified.  We discussed that we do not know why why there is cancer in the family. It could be due to a different gene that we are not testing, or something our current technology cannot pick up.  It will be important for her to keep in contact with genetics to learn if additional testing may be needed in the future. She should continue to follow doctor recommendations for cancer screening. Her brother and sister could also have genetic counseling and testing.

## 2018-10-10 NOTE — Progress Notes (Addendum)
HPI:  Maureen Peterson was previously seen in the [facility] clinic on 09/28/2018 due to a family history of cancer and concerns regarding a hereditary predisposition to cancer. Please refer to our prior cancer genetics clinic note for more information regarding Maureen Peterson's medical, social and family histories, and our assessment and recommendations, at the time. Maureen Peterson recent genetic test results were disclosed to her, as well as recommendations warranted by these results. These results and recommendations are discussed in more detail below.  CANCER HISTORY:   No history exists.     FAMILY HISTORY:  We obtained a detailed, 4-generation family history.  Significant diagnoses are listed below: Family History  Problem Relation Age of Onset  . Breast cancer Daughter 46  . Uterine cancer Mother 40  . Pancreatic cancer Mother 68  . Melanoma Father        on arm  . Non-Hodgkin's lymphoma Brother 69   Maureen Peterson has one daughter, Maureen Peterson, who is 60 and was recently diagnosed with breast cancer. She tested negative on the [facility] Multi-Cancer Panel. Maureen Peterson also has one son, Maureen Peterson, who is 48. Maureen Peterson had three brothers and one sister. One of her brothers, Maureen Peterson, had Non-Hodgkin's Lymphoma and died at 45. Another brother, Maureen Peterson, passed away at age 104. Her other siblings are living and healthy.   Maureen Peterson mother, Maureen Peterson, was diagnosed with uterine cancer at 35 and pancreatic cancer at 64. She passed away at 70. She did not have brothers or sisters. Maureen Peterson maternal grandmother died in her 32's and possibly had bladder cancer. She does not know when her maternal grandfather died.   Maureen Peterson father, Maureen Peterson, died at 37. He had a history of melanoma on his arm. He had 13 brothers and sisters, but Maureen Peterson does not have any information about them or their children. She also has no information about her paternal grandfather. She believes her paternal grandmother died in her 44's.     Maureen Peterson is aware of previous family history of genetic testing for hereditary cancer risks. Patient's ancestors are of Irish/Italian descent. There is no reported Ashkenazi Jewish ancestry. There is no known consanguinity  GENETIC TEST RESULTS: Genetic testing performed through [facility]'s Multi-Cancer Panel reported out on 10/06/2018 showed no pathogenic mutations. The Multi-Cancer Panel offered by [facility] includes sequencing and/or deletion duplication testing of the following 84 genes: AIP, ALK, APC, ATM, AXIN2,BAP1,  BARD1, BLM, BMPR1A, BRCA1, BRCA2, BRIP1, CASR, CDC73, CDH1, CDK4, CDKN1B, CDKN1C, CDKN2A (p14ARF), CDKN2A (p16INK4a), CEBPA, CHEK2, CTNNA1, DICER1, DIS3L2, EGFR (c.2369C>T, p.Thr790Met variant only), EPCAM (Deletion/duplication testing only), FH, FLCN, GATA2, GPC3, GREM1 (Promoter region deletion/duplication testing only), HOXB13 (c.251G>A, p.Gly84Glu), HRAS, KIT, MAX, MEN1, MET, MITF (c.952G>A, p.Glu318Lys variant only), MLH1, MSH2, MSH3, MSH6, MUTYH, NBN, NF1, NF2, NTHL1, PALB2, PDGFRA, PHOX2B, PMS2, POLD1, POLE, POT1, PRKAR1A, PTCH1, PTEN, RAD50, RAD51C, RAD51D, RB1, RECQL4, RET, RUNX1, SDHAF2, SDHA (sequence changes only), SDHB, SDHC, SDHD, SMAD4, SMARCA4, SMARCB1, SMARCE1, STK11, SUFU, TERC, TERT, TMEM127, TP53, TSC1, TSC2, VHL, WRN and WT1.  A variant of uncertain significance (VUS) in a gene called APC was also noted.   The test report will be scanned into EPIC and will be located under the Molecular Pathology section of the Results Review tab. A portion of the result report is included below for reference.     We discussed with Maureen Peterson that because current genetic testing is not perfect, it is possible there may be a gene mutation in one of these  genes that current testing cannot detect, but that chance is small.  We also discussed, that there could be another gene that has not yet been discovered, or that we have not yet tested, that is responsible for the cancer diagnoses  in the family. It is also possible there is a hereditary cause for the cancer in the family that Maureen Peterson did not inherit and therefore was not identified in her testing.  Therefore, it is important to remain in touch with cancer genetics in the future so that we can continue to offer Maureen Peterson the most up to date genetic testing.   Regarding the VUS in APC: At this time, it is unknown if this variant is associated with increased cancer risk or if this is a normal finding, but most variants such as this get reclassified to being inconsequential. It should not be used to make medical management decisions. With time, we suspect the lab will determine the significance of this variant, if any. If we do learn more about it, we will try to contact Maureen Peterson to discuss it further. However, it is important to stay in touch with [place] periodically and keep the address and phone number up to date.  ADDITIONAL GENETIC TESTING: We discussed with Maureen Peterson that her genetic testing was fairly extensive.  If there are are genes identified to increase cancer risk that can be analyzed in the future, we would be happy to discuss and coordinate this testing at that time.    CANCER SCREENING RECOMMENDATIONS: Maureen Peterson test result is considered negative (normal).  This means that we have not identified a hereditary cause for her family history of cancer at this time.   This normal indicates that it is unlikely Maureen Peterson has an increased risk of cancer due to a mutation in one of these genes. While reassuring, this does not definitively rule out a hereditary predisposition to cancer. It is still possible that there could be genetic mutations that are undetectable by current technology, or genetic mutations in genes that have not been tested or identified to increase cancer risk.  Therefore, it is recommended she continue to follow the cancer management and screening guidelines provided by her oncology and primary healthcare  provider. An individual's cancer risk is not determined by genetic test results alone.  Overall cancer risk assessment includes additional factors such as personal medical history, family history, etc.  These should be used to make a personalized plan for cancer prevention and surveillance.     Due to the family history of pancreatic cancer in her mother she could also consider pancreatic cancer screening. There are no specific recommendations regarding pancreatic cancer screening and there is not yet enough data to prove efficacy at detecting all pancreatic cancers early.  However, there are specialists who could discuss these options with her if she is interested.  Some techniques include abdominal MRI and endoscopic ultrasound with biopsy of suspicious masses.   Based on the Ms. Daniello's personal and family history of cancer, as well as her genetic test results statistical model Harriett Rush was used to estimate her risk of developing breast cancer. This estimates her lifetime risk of developing breast cancer to be approximately 8.2%. The patient's lifetime breast cancer risk is a preliminary estimate based on available information using one of several models endorsed by the [employer] (ACS). The ACS recommends consideration of breast MRI screening as an adjunct to mammography for patients at high risk (defined as 20% or  greater lifetime risk). A more detailed breast cancer risk assessment can be considered, if clinically indicated.     RECOMMENDATIONS FOR FAMILY MEMBERS:  Relatives in this family might be at some increased risk of developing cancer, over the general population risk, simply due to the family history of cancer.  We recommended women in this family have a yearly mammogram beginning at age 29, or 69 years younger than the earliest onset of cancer, an annual clinical breast exam, and perform monthly breast self-exams. Women in this family should also have a gynecological exam as  recommended by their primary provider. All family members should have a colonoscopy as directed by their doctors.  All family members should inform their physicians about the family history of cancer so their doctors can make the most appropriate screening recommendations for them.   It is also possible there is a hereditary cause for the cancer in Ms. Negash's family that she did not inherit and therefore was not identified in her.  We recommended her brother and sister have genetic counseling and testing. Ms. Garman will let us know if we can be of any assistance in coordinating genetic counseling and/or testing for these family members.   FOLLOW-UP: Lastly, we discussed with Ms. Kempa that cancer genetics is a rapidly advancing field and it is possible that new genetic tests will be appropriate for her and/or her family members in the future. We encouraged her to remain in contact with cancer genetics on an annual basis so we can update her personal and family histories and let her know of advances in cancer genetics that may benefit this family.   Our contact number was provided. Ms. Lingerfelt questions were answered to her satisfaction, and she knows she is welcome to call us at anytime with additional questions or concerns.  Faith Rogue, MS Genetic Counselor [email or web address] Phone: [phone]

## 2018-10-11 ENCOUNTER — Other Ambulatory Visit: Payer: Self-pay

## 2018-10-13 ENCOUNTER — Other Ambulatory Visit: Payer: Self-pay | Admitting: Family Medicine

## 2018-10-13 MED ORDER — EZETIMIBE 10 MG PO TABS: 10.0000 mg | ORAL_TABLET | Freq: Every day | ORAL | 3 refills | Status: DC

## 2018-10-20 ENCOUNTER — Other Ambulatory Visit: Payer: Self-pay | Admitting: Family Medicine

## 2018-10-20 NOTE — Telephone Encounter (Signed)
Copied from CRM #[id number]. [id number]: Quick Communication - Rx Refill/Question >> Oct 20, 2018  9:56 AM Mickel BaasMcGee, Breya Cass B, NT wrote: Medication: ibuprofen (ADVIL,MOTRIN) 800 MG tablet diclofenac (VOLTAREN) 75 MG EC tablet   Has the patient contacted their pharmacy? Yes.  To call the office (Agent: If no, request that the patient contact the pharmacy for the refill.) (Agent: If yes, when and what did the pharmacy advise?)  Preferred Pharmacy (with phone number or street name): [id number] - [place], [place] - [address]  Agent: Please be advised that RX refills may take up to 3 business days. We ask that you follow-up with your pharmacy.

## 2018-10-20 NOTE — Telephone Encounter (Signed)
Requested medication (s) are due for refill today: yes  Requested medication (s) are on the active medication list: yes  Last refill:  Diclofenac: 06/30/18      Ibuprofen: 06/20/18  Future visit scheduled: yes  Notes to clinic:  Historical provider   Requested Prescriptions  Pending Prescriptions Disp Refills   diclofenac (VOLTAREN) 75 MG EC tablet      Sig: Take 1 tablet (75 mg total) by mouth 2 (two) times daily.     Analgesics:  NSAIDS Passed - 10/20/2018 10:03 AM      Passed - Cr in normal range and within 360 days    Creatinine, Ser  Date Value Ref Range Status  08/11/2018 0.74 0.40 - 1.20 mg/dL Final         Passed - HGB in normal range and within 360 days    Hemoglobin  Date Value Ref Range Status  08/11/2018 12.6 12.0 - 15.0 g/dL Final         Passed - Patient is not pregnant      Passed - Valid encounter within last 12 months    Recent Outpatient Visits          1 month ago [facility]al URI   [facility] Bettey MareShannon R, MD   2 months ago Well adult exam   [facility] Bettey MareShannon R, MD      Future Appointments            In 4 months Salomon FickBanks, Bettey MareShannon R, [facility]          ibuprofen (ADVIL,MOTRIN) 800 MG tablet 30 tablet 0     Analgesics:  NSAIDS Passed - 10/20/2018 10:03 AM      Passed - Cr in normal range and within 360 days    Creatinine, Ser  Date Value Ref Range Status  08/11/2018 0.74 0.40 - 1.20 mg/dL Final         Passed - HGB in normal range and within 360 days    Hemoglobin  Date Value Ref Range Status  08/11/2018 12.6 12.0 - 15.0 g/dL Final         Passed - Patient is not pregnant      Passed - Valid encounter within last 12 months    Recent Outpatient Visits          1 month ago Viral URI   [facility] Bettey MareShannon R, MD   2 months ago Well adult exam   [facility], Bettey MareShannon R, MD      Future Appointments       In 4 months Salomon FickBanks, Bettey MareShannon R, [facility]

## 2018-10-23 NOTE — Telephone Encounter (Signed)
Ok to send refills? 

## 2018-10-24 MED ORDER — IBUPROFEN 800 MG PO TABS: 800.0000 mg | ORAL_TABLET | Freq: Three times a day (TID) | ORAL | 1 refills | Status: DC | PRN

## 2018-10-24 MED ORDER — DICLOFENAC SODIUM 75 MG PO TBEC: 75.0000 mg | DELAYED_RELEASE_TABLET | Freq: Two times a day (BID) | ORAL | 1 refills | Status: DC | PRN

## 2018-12-13 DIAGNOSIS — Z85828 Personal history of other malignant neoplasm of skin: Secondary | ICD-10-CM | POA: Insufficient documentation

## 2018-12-13 DIAGNOSIS — R7303 Prediabetes: Secondary | ICD-10-CM | POA: Insufficient documentation

## 2018-12-13 DIAGNOSIS — Z9889 Other specified postprocedural states: Secondary | ICD-10-CM | POA: Insufficient documentation

## 2018-12-13 DIAGNOSIS — I83813 Varicose veins of bilateral lower extremities with pain: Secondary | ICD-10-CM | POA: Insufficient documentation

## 2019-02-06 DIAGNOSIS — A6000 Herpesviral infection of urogenital system, unspecified: Secondary | ICD-10-CM | POA: Insufficient documentation

## 2019-02-19 ENCOUNTER — Ambulatory Visit: Payer: Self-pay | Admitting: Family Medicine

## 2019-06-13 DIAGNOSIS — T23261A Burn of second degree of back of right hand, initial encounter: Secondary | ICD-10-CM | POA: Insufficient documentation

## 2019-09-13 DIAGNOSIS — M65341 Trigger finger, right ring finger: Secondary | ICD-10-CM | POA: Insufficient documentation

## 2019-11-04 ENCOUNTER — Other Ambulatory Visit: Payer: Self-pay | Admitting: Family Medicine

## 2019-11-29 DIAGNOSIS — M79641 Pain in right hand: Secondary | ICD-10-CM | POA: Insufficient documentation

## 2019-12-06 ENCOUNTER — Other Ambulatory Visit: Payer: Self-pay

## 2019-12-06 ENCOUNTER — Encounter: Payer: Self-pay | Admitting: Podiatry

## 2019-12-06 ENCOUNTER — Ambulatory Visit: Payer: Medicare Other | Admitting: Podiatry

## 2019-12-06 DIAGNOSIS — M7751 Other enthesopathy of right foot: Secondary | ICD-10-CM | POA: Diagnosis not present

## 2019-12-06 DIAGNOSIS — M778 Other enthesopathies, not elsewhere classified: Secondary | ICD-10-CM

## 2019-12-06 DIAGNOSIS — L603 Nail dystrophy: Secondary | ICD-10-CM

## 2019-12-06 NOTE — Progress Notes (Signed)
She presents today chief complaint of shooting pain and throbbing pain of the first metatarsophalangeal joint of the left foot.  She is also complaining of pain to the fifth metatarsophalangeal joint area of the right foot.  States that the last time when we did shot she did very well for quite some times but has been having to over do it here recently which caused her feet to be sore once again.  He is also concerned about the thickness of the hallux nails bilaterally which she has recently trimmed.  ROS: Denies fever chills nausea vomiting muscle aches pains calf pain back pain chest pain shortness of breath.  Objective: Vital signs are stable alert oriented x3.  Pulses are palpable.  Neurologic sensorium is intact.  Deep tendon reflexes are intact.  Muscle strength is normal symmetrical bilateral.  Orthopedic evaluation demonstrates pain on palpation and range of motion of the first metatarsophalangeal joint left foot.  She also has a palpable bursitis of the fifth metatarsophalangeal joint of the right foot.  Assessment: Bursitis fifth metatarsophalangeal joint right.  Nail dystrophy hallux bilateral.  Cannot rule out onychomycosis.  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  Plan: Discussed etiology pathology conservative surgical therapies at this point in time after sterile Betadine skin prep I injected into the joint of the first metatarsophalangeal joint left 20 mg of Kenalog with local anesthetic.  I injected 2 mg of dexamethasone with local anesthetic into the bursa of the fifth metatarsal area right.  I instructed her to come back with follow-up with me in 2 months at which time we will take samples of the toenails and sent for pathologic evaluations.

## 2020-02-07 ENCOUNTER — Ambulatory Visit: Payer: Medicare Other | Admitting: Podiatry

## 2020-02-07 ENCOUNTER — Other Ambulatory Visit: Payer: Self-pay

## 2020-02-07 DIAGNOSIS — M7751 Other enthesopathy of right foot: Secondary | ICD-10-CM

## 2020-02-07 DIAGNOSIS — L603 Nail dystrophy: Secondary | ICD-10-CM

## 2020-02-07 NOTE — Progress Notes (Signed)
She presents today for follow-up of first metatarsophalangeal joint left foot and bursitis fifth metatarsophalangeal joint right foot.  She is also let her grown toenails grow out so that we can take samples of them.  Objective: Vital signs are stable she is alert and oriented x3 she states that the there is no pain on range of motion of the first metatarsophalangeal joint of the left foot but there is still some consistent pain but 80% improved on the plantar aspect of the fifth metatarsal right foot which is mildly tender on palpation.  Toenails are thick yellow dystrophic-like mycotic.  Assessment: Bursitis subfifth metatarsal head right foot resolving capsulitis first metatarsophalangeal joint left foot.  Nail dystrophy hallux right and third digit left.  Plan: Discussed etiology pathology conservative versus surgical therapies reinjected the plantar fifth metatarsophalangeal with about 1 mg of dexamethasone and local anesthetic I also debrided toenails today for samples and skin was sent as well.  Follow-up with her in about 1 month.

## 2020-02-22 ENCOUNTER — Telehealth: Payer: Self-pay | Admitting: *Deleted

## 2020-02-22 NOTE — Telephone Encounter (Signed)
Left message for pt to call for results  

## 2020-02-22 NOTE — Telephone Encounter (Signed)
I informed pt of Dr. Hyatt's review of results. 

## 2020-02-22 NOTE — Telephone Encounter (Signed)
Pt called for results.

## 2020-02-22 NOTE — Telephone Encounter (Signed)
-----   Message from Elinor Parkinson, [place] sent at 02/21/2020  7:45 AM EDT ----- Neg for nail fungus.  Micro trauma.  No need to fu for this.

## 2020-03-11 ENCOUNTER — Ambulatory Visit: Payer: Medicare Other | Admitting: Podiatry

## 2020-06-05 DIAGNOSIS — G5621 Lesion of ulnar nerve, right upper limb: Secondary | ICD-10-CM | POA: Insufficient documentation

## 2020-06-16 DIAGNOSIS — Z860100 Personal history of colon polyps, unspecified: Secondary | ICD-10-CM | POA: Insufficient documentation

## 2020-06-23 IMAGING — MG DIGITAL SCREENING BILATERAL MAMMOGRAM WITH TOMO AND CAD
8 series · 8 of 24 positions shown · non-contrast
Comparison: Previous exam(s).

CLINICAL DATA: Screening.

EXAM:
DIGITAL SCREENING BILATERAL MAMMOGRAM WITH TOMO AND CAD

[R CC synth-2D]
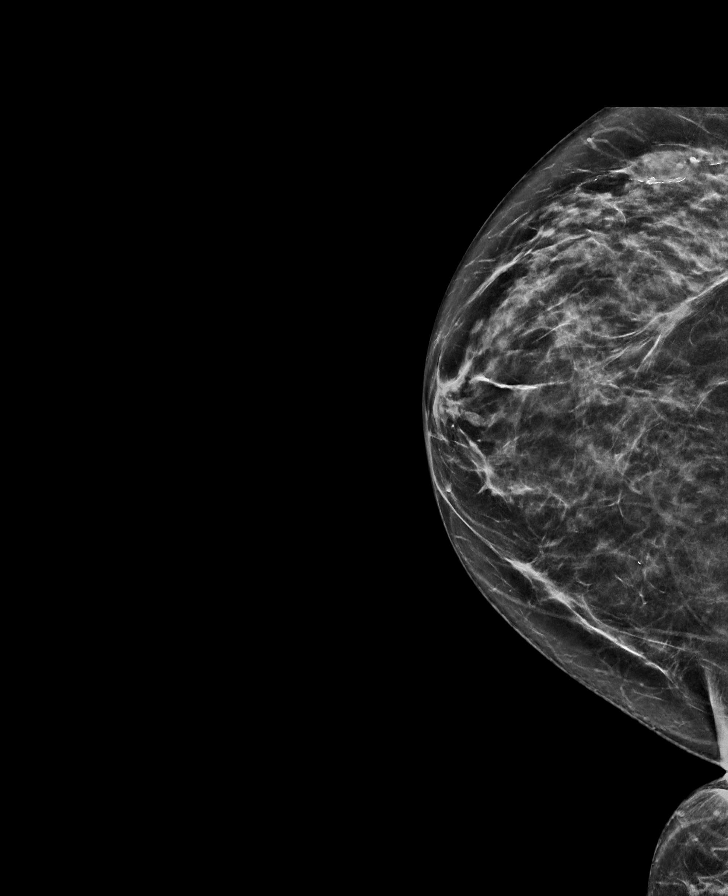

[L MLO synth-2D]
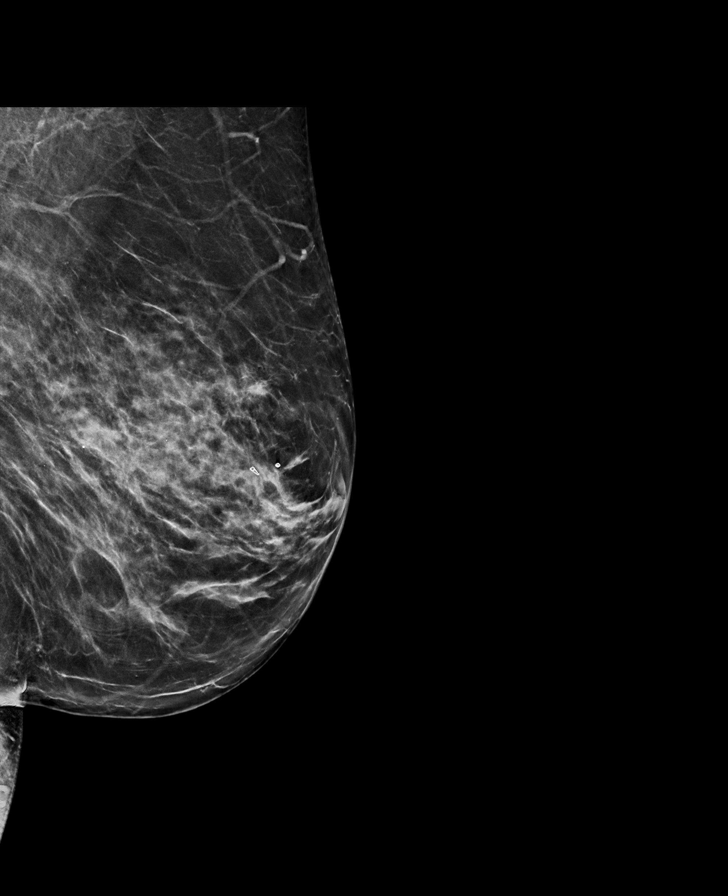

[R MLO synth-2D]
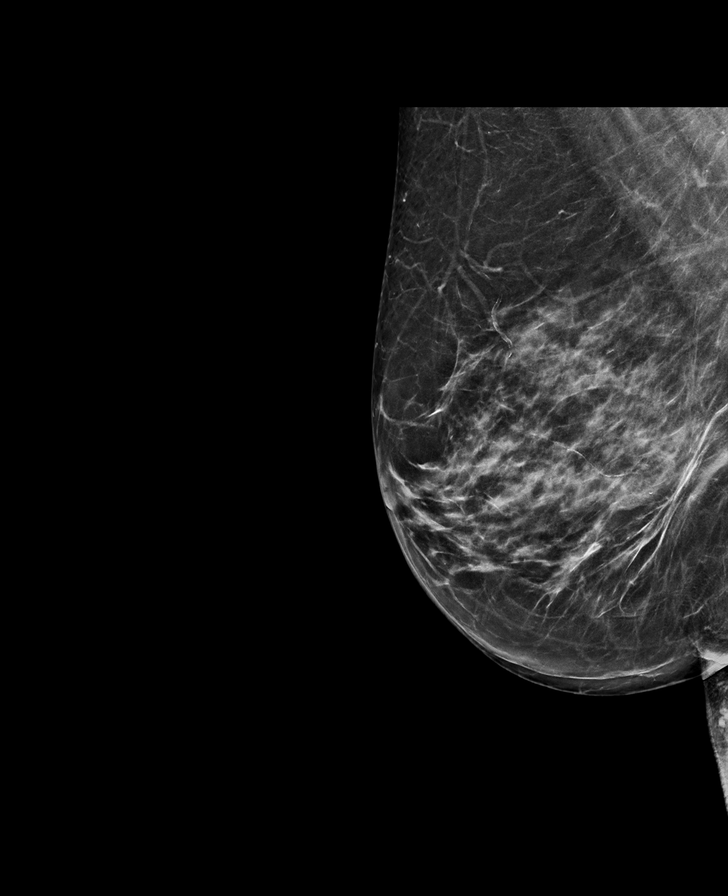

[L CC synth-2D]
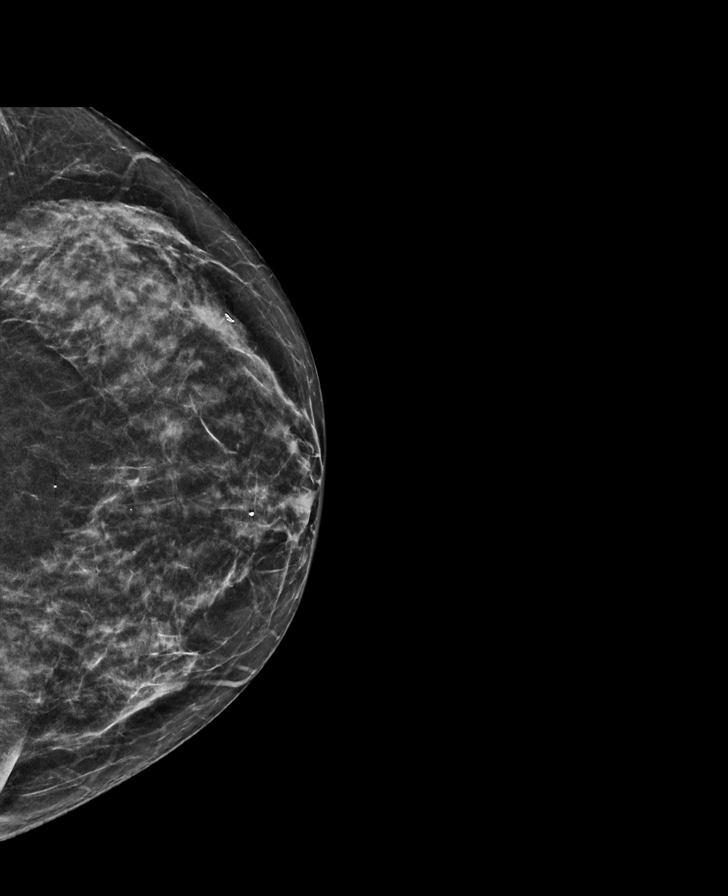

[L MLO tomo · tomo slice 37/74.0]
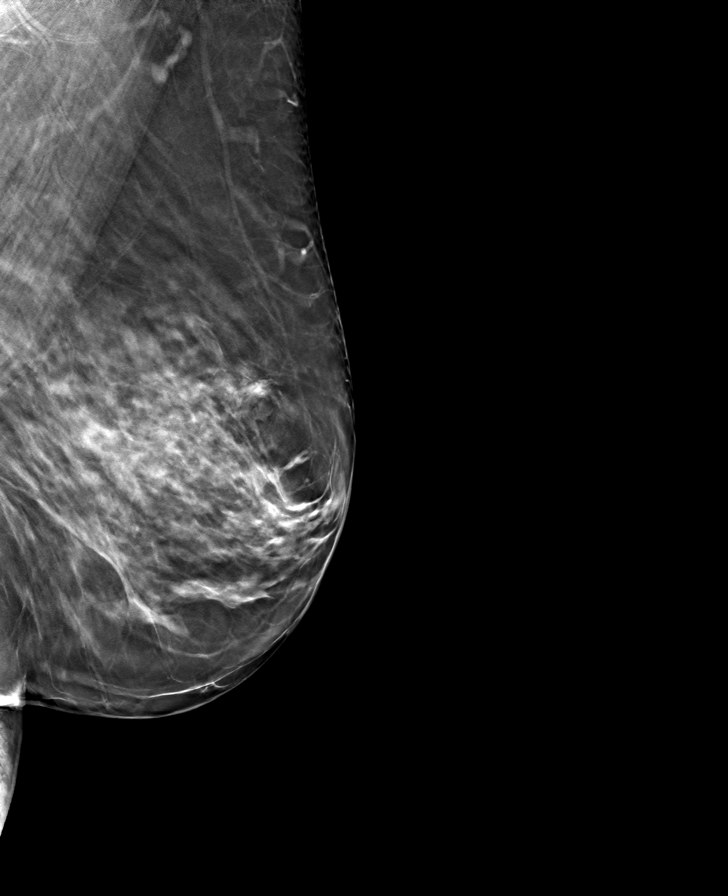

[L CC tomo · tomo slice 32/63.0]
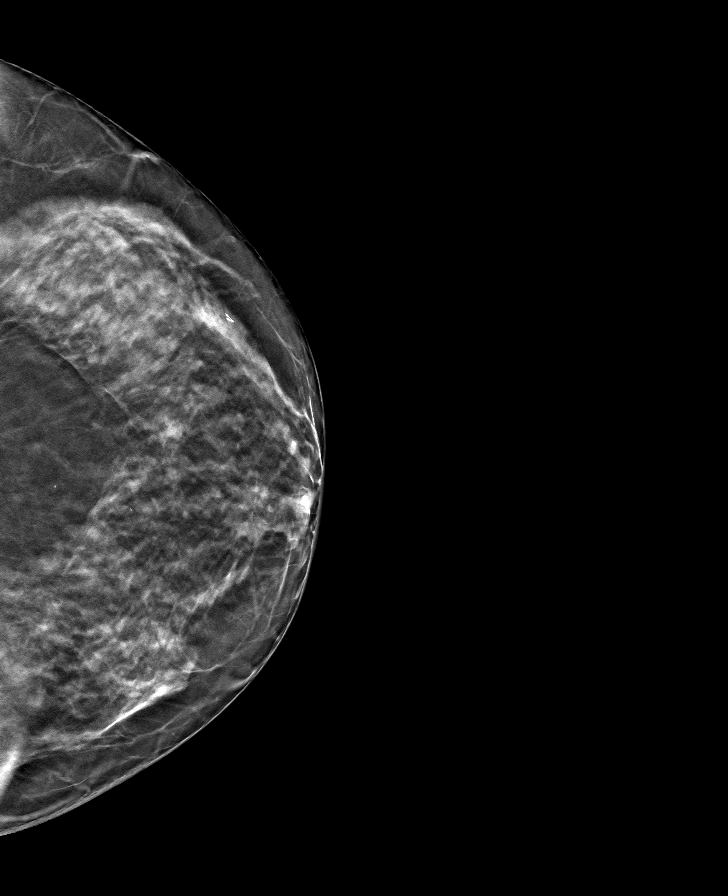

[R MLO tomo · tomo slice 37/72.0]
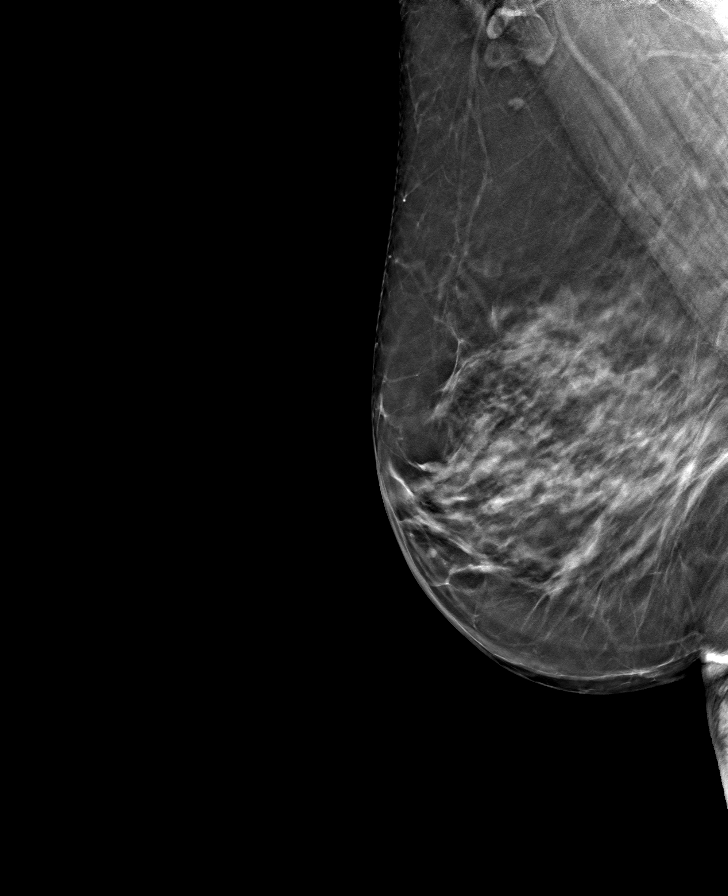

[R CC tomo · tomo slice 33/65.0]
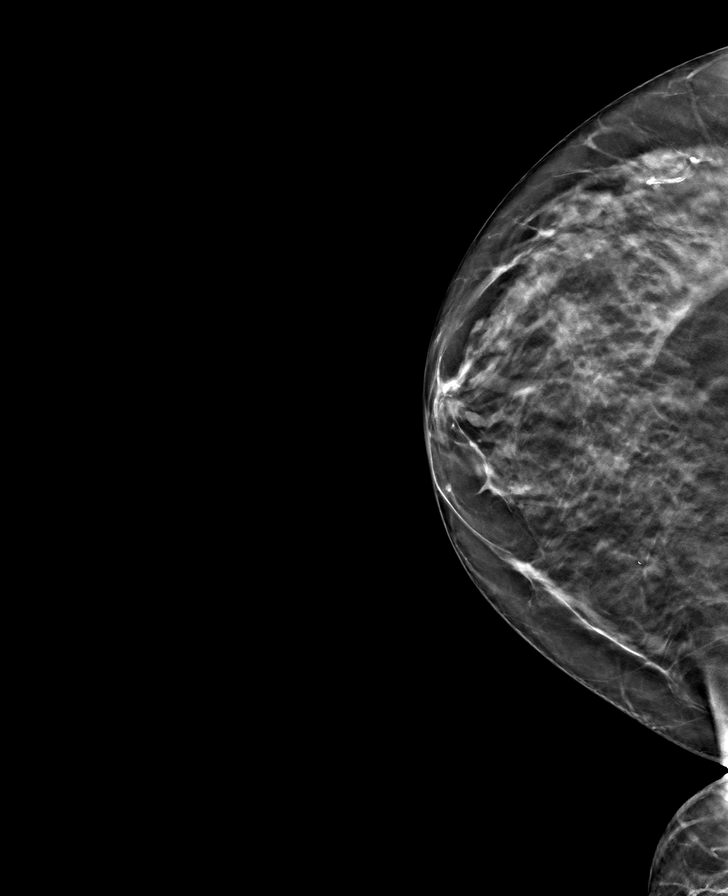

[8 of 24 positions shown; findings below may reference images not displayed]

ACR Breast Density Category c: The breast tissue is heterogeneously
dense, which may obscure small masses.
FINDINGS: There are no findings suspicious for malignancy. Images were
processed with CAD.
IMPRESSION: No mammographic evidence of malignancy. A result letter of this
screening mammogram will be mailed directly to the patient.

RECOMMENDATION:
Screening mammogram in one year. (Code:FT-U-LHB)

BI-RADS CATEGORY  1: Negative.

## 2020-11-15 ENCOUNTER — Other Ambulatory Visit: Payer: Self-pay

## 2020-11-15 ENCOUNTER — Ambulatory Visit
Admission: EM | Admit: 2020-11-15 | Discharge: 2020-11-15 | Disposition: A | Discharge status: Home / Self Care | Payer: Medicare Other | Attending: Emergency Medicine | Admitting: Emergency Medicine

## 2020-11-15 DIAGNOSIS — J029 Acute pharyngitis, unspecified: Secondary | ICD-10-CM | POA: Diagnosis not present

## 2020-11-15 MED ORDER — SUCRALFATE 1 GM/10ML PO SUSP: 1.0000 g | Freq: Three times a day (TID) | ORAL | 0 refills | Status: DC
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
## 2020-11-15 NOTE — ED Triage Notes (Signed)
Patient states she has had Sinus drainage and congestion since Wednesday. Pt is now having a sore throat and left ear pain. Pt is aox4 and ambulatory.

## 2020-11-18 LAB — NOVEL CORONAVIRUS, NAA: SARS-CoV-2, NAA: NOT DETECTED

## 2020-12-11 DIAGNOSIS — H9202 Otalgia, left ear: Secondary | ICD-10-CM | POA: Insufficient documentation

## 2020-12-18 ENCOUNTER — Telehealth: Payer: Self-pay | Admitting: Family Medicine

## 2020-12-18 NOTE — Telephone Encounter (Signed)
Left message for patient to call back and schedule Medicare Annual Wellness Visit (AWV) either virtually or in office.   Last AWV no information  please schedule at anytime with [facility] Nurse Health Advisor 1 or 2   This should be a 45 minute visit. Patient also needs appointment with pcp last appointment 09/05/18

## 2021-02-09 ENCOUNTER — Telehealth: Payer: Self-pay | Admitting: Family Medicine

## 2021-02-09 NOTE — Telephone Encounter (Signed)
Left message for patient to call back and schedule Medicare Annual Wellness Visit (AWV) either virtually or in office. No detailed message left   awvi per [facility] 11/15/20  please schedule at anytime with [facility] Nurse Health Advisor 1 or 2   This should be a 45 minute visit. Patient also needs appointment with pcp last appointment 09/05/18

## 2021-03-11 DIAGNOSIS — C7A8 Other malignant neuroendocrine tumors: Secondary | ICD-10-CM | POA: Insufficient documentation

## 2021-03-24 DIAGNOSIS — C7A8 Other malignant neuroendocrine tumors: Secondary | ICD-10-CM | POA: Insufficient documentation

## 2021-05-06 ENCOUNTER — Telehealth: Payer: Self-pay | Admitting: Family Medicine

## 2021-05-06 NOTE — Telephone Encounter (Signed)
Left message for patient to call back and schedule Medicare Annual Wellness Visit (AWV) either virtually or in office.   awvi per [place] 11/15/20   please schedule at anytime with [facility] Nurse Health Advisor 1 or 2  Pt also needs appointment with pcp last appointment 09/05/18  This should be a 45 minute visit.

## 2023-05-26 DIAGNOSIS — E119 Type 2 diabetes mellitus without complications: Secondary | ICD-10-CM | POA: Insufficient documentation

## 2023-08-16 DIAGNOSIS — R9089 Other abnormal findings on diagnostic imaging of central nervous system: Secondary | ICD-10-CM | POA: Insufficient documentation

## 2023-08-16 DIAGNOSIS — D329 Benign neoplasm of meninges, unspecified: Secondary | ICD-10-CM | POA: Insufficient documentation

## 2024-04-05 ENCOUNTER — Ambulatory Visit (INDEPENDENT_AMBULATORY_CARE_PROVIDER_SITE_OTHER): Admitting: Podiatry

## 2024-04-05 ENCOUNTER — Ambulatory Visit (INDEPENDENT_AMBULATORY_CARE_PROVIDER_SITE_OTHER)

## 2024-04-05 ENCOUNTER — Encounter: Payer: Self-pay | Admitting: Podiatry

## 2024-04-05 DIAGNOSIS — M722 Plantar fascial fibromatosis: Secondary | ICD-10-CM | POA: Diagnosis not present

## 2024-04-05 DIAGNOSIS — M19071 Primary osteoarthritis, right ankle and foot: Secondary | ICD-10-CM | POA: Diagnosis not present

## 2024-04-05 DIAGNOSIS — S9032XA Contusion of left foot, initial encounter: Secondary | ICD-10-CM

## 2024-04-05 DIAGNOSIS — M19072 Primary osteoarthritis, left ankle and foot: Secondary | ICD-10-CM | POA: Diagnosis not present

## 2024-04-05 MED ORDER — TRIAMCINOLONE ACETONIDE 40 MG/ML IJ SUSP
40.0000 mg | Freq: Once | INTRAMUSCULAR | Status: AC
  Administered 2024-04-05: 40 mg

## 2024-04-05 NOTE — Progress Notes (Signed)
 Subjective:  Patient ID: Maureen Peterson, female    DOB: November 09, 1956,  MRN: [id number] HPI Chief Complaint  Patient presents with   Foot Pain    Dorsal/lateral left - trash can fell on foot 2 months ago, broke skin and kept neosporin and bandaid over until healed, but now has a knot and still very tender  Patient states her feet hurt every morning when she gets up, thinks arthritis, but also in cancer treatments   New Patient (Initial Visit)    Est pt 2021-diabetic - 6.1    68 y.o. female presents with the above complaint.   ROS: Denies fever chills nausea mobic muscle aches pains calf pain back pain chest pain shortness of breath.  Past Medical History:  Diagnosis Date   Arthritis    Family history of breast cancer    Family history of melanoma    Family history of non-Hodgkin's lymphoma    Family history of pancreatic cancer    Family history of uterine cancer    Genital herpes    Heart murmur    Hepatitis    History of colon polyps    Hyperlipidemia    UTI (urinary tract infection)    Past Surgical History:  Procedure Laterality Date   BREAST BIOPSY Left    REDUCTION MAMMAPLASTY      Current Outpatient Medications:    atorvastatin (LIPITOR) 40 MG tablet, Take 40 mg by mouth at bedtime., Disp: , Rfl:    lanreotide acetate (SOMATULINE DEPOT) 120 MG/0.5ML injection, Inject 120 mg into the skin every 28 (twenty-eight) days., Disp: , Rfl:    lipase/protease/amylase (CREON) 36000 UNITS CPEP capsule, Take by mouth., Disp: , Rfl:    SUMAtriptan (IMITREX) 25 MG tablet, Take 25 mg by mouth., Disp: , Rfl:    Telotristat Ethyl,as Etiprate, (XERMELO) 250 MG TABS, Take 250 mg by mouth., Disp: , Rfl:    Calcium Carbonate-Vit D-Min (CALCIUM 1200 PO), Take by mouth., Disp: , Rfl:    hydrOXYzine (VISTARIL) 25 MG capsule, Take 25 mg by mouth at bedtime as needed., Disp: , Rfl:    metFORMIN (GLUCOPHAGE-XR) 500 MG 24 hr tablet, Take 500 mg by mouth every morning., Disp: , Rfl:     valACYclovir (VALTREX) 1000 MG tablet, Take 1 tablet by mouth twice daily for 7 days, Disp: , Rfl:    VITAMIN D PO, Take by mouth., Disp: , Rfl:   Allergies  Allergen Reactions   Ezetimibe  Other (See Comments)    Joints stiff & painful, Headache , tingling nose & lip   Lips tingling  Joints stiff & painful, Headache , tingling nose & lip   Rosuvastatin Other (See Comments)   Review of Systems Objective:  There were no vitals filed for this visit.  General: Well developed, nourished, in no acute distress, alert and oriented x3   Dermatological: Skin is warm, dry and supple bilateral. Nails x 10 are well maintained; remaining integument appears unremarkable at this time. There are no open sores, no preulcerative lesions, no rash or signs of infection present.  Vascular: Dorsalis Pedis artery and Posterior Tibial artery pedal pulses are 2/4 bilateral with immedate capillary fill time. Pedal hair growth present. No varicosities and no lower extremity edema present bilateral.   Neruologic: Grossly intact via light touch bilateral. Vibratory intact via tuning fork bilateral. Protective threshold with Semmes Wienstein monofilament intact to all pedal sites bilateral. Patellar and Achilles deep tendon reflexes 2+ bilateral. No Babinski or clonus noted bilateral.   Musculoskeletal: No  gross boney pedal deformities bilateral. No pain, crepitus, or limitation noted with foot and ankle range of motion bilateral. Muscular strength 5/5 in all groups tested bilateral.  Left fifth metatarsal demonstrates a contusion that has left a periosteal reaction and 2 small bumps that feel like they are attached to the bone on the dorsal and dorsal lateral diaphysis of the fifth metatarsal.  Right foot demonstrates tenderness on palpation of the heel as does the left.  No pain on medial-lateral compression of the calcaneus majority the pain is associated with the heel pain.  May  Gait: Unassisted, Nonantalgic.     Radiographs:  Radiographs taken today demonstrate osseously mature foot no significant abnormalities are identified there is mild osteopenia with demineralization of the bone she does have soft tissue increase in density at the plantar fascial calcaneal insertion sites and some early osteoarthritic changes of the midfoot.  Assessment & Plan:   Assessment: Contusion fifth metatarsal left foot.  Plantar fasciitis bilateral.   Plan: Discussed etiology pathology conservative surgical therapies at this point injected the bilateral heels today 20 mg Kenalog  5 mg Marcaine for maximal tenderness.  Follow-up with her in 6 weeks     Patrice Matthew T. [place], [place]

## 2024-05-17 ENCOUNTER — Ambulatory Visit: Admitting: Podiatry

## 2024-05-17 ENCOUNTER — Encounter: Payer: Self-pay | Admitting: Podiatry

## 2024-05-17 DIAGNOSIS — M722 Plantar fascial fibromatosis: Secondary | ICD-10-CM | POA: Diagnosis not present

## 2024-05-17 MED ORDER — TRIAMCINOLONE ACETONIDE 40 MG/ML IJ SUSP
40.0000 mg | Freq: Once | INTRAMUSCULAR | Status: AC
  Administered 2024-05-17: 40 mg

## 2024-05-17 NOTE — Progress Notes (Signed)
 She presents today for follow-up of her bilateral heels she says they are particularly painful in the morning.  States that she feels like they are better anywhere from 25 to 45%.  Objective: Vital signs are stable alert oriented x 3.  Pain on palpation bilateral heels.  No pain on medial-lateral compression of the calcaneus.  Assessment: Plantar fasciitis bilateral.  Plan: Injected the bilateral heel today 20 mg Kenalog  5 mg Marcaine for maximal tenderness.  Discussed appropriate shoe gear normal follow-up with her own as needed basis or in 2 months

## 2024-06-07 DIAGNOSIS — R200 Anesthesia of skin: Secondary | ICD-10-CM | POA: Insufficient documentation

## 2024-07-12 ENCOUNTER — Encounter: Payer: Self-pay | Admitting: Podiatry

## 2024-07-12 ENCOUNTER — Ambulatory Visit (INDEPENDENT_AMBULATORY_CARE_PROVIDER_SITE_OTHER): Admitting: Podiatry

## 2024-07-12 DIAGNOSIS — M19072 Primary osteoarthritis, left ankle and foot: Secondary | ICD-10-CM | POA: Diagnosis not present

## 2024-07-12 DIAGNOSIS — M722 Plantar fascial fibromatosis: Secondary | ICD-10-CM

## 2024-07-12 MED ORDER — TRIAMCINOLONE ACETONIDE 40 MG/ML IJ SUSP
40.0000 mg | Freq: Once | INTRAMUSCULAR | Status: AC
  Administered 2024-07-12: 40 mg

## 2024-07-12 NOTE — Progress Notes (Signed)
 She presents today for follow-up of her plantar fasciitis bilaterally states that the pain is mild she is has a little bit in the left heel and right under here she points beneath the fibular sesamoid area of the first intermetatarsal space.  She states that it bothered her on and off for quite some time she just did not mention it.  Objective: Vital signs are stable and oriented x 3.  Pulses are palpable.  She has no pain on palpation of the right heel but she does have some pinpoint tenderness on the palpation of the left heel at the plantar fascia calcaneal insertion site.  She also has tenderness on palpation of the fibular sesamoid.  Assessment: Well-healing plantar fasciitis bilaterally.  Mild sesamoidal osteoarthritic change.  Plan: Discussed etiology pathology and surgical therapies I injected the first intermetatarsal space and I I injected the left heel also.
